# Patient Record
Sex: Female | Born: 2005
Health system: Southern US, Community
[De-identification: ages and names within clinical notes are randomized; demographics above are authoritative.]

## PROBLEM LIST (undated history)

## (undated) DIAGNOSIS — Z8489 Family history of other specified conditions: Secondary | ICD-10-CM

## (undated) DIAGNOSIS — F419 Anxiety disorder, unspecified: Secondary | ICD-10-CM

## (undated) DIAGNOSIS — T7840XA Allergy, unspecified, initial encounter: Secondary | ICD-10-CM

## (undated) HISTORY — PX: NO PAST SURGERIES: SHX2092

## (undated) HISTORY — DX: Allergy, unspecified, initial encounter: T78.40XA

## (undated) HISTORY — DX: Anxiety disorder, unspecified: F41.9

---
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
## 2023-01-20 ENCOUNTER — Other Ambulatory Visit: Payer: Self-pay | Admitting: Licensed Practical Nurse

## 2023-01-20 DIAGNOSIS — F3289 Other specified depressive episodes: Secondary | ICD-10-CM

## 2023-01-30 ENCOUNTER — Encounter: Payer: Self-pay | Admitting: Certified Nurse Midwife

## 2023-01-30 ENCOUNTER — Ambulatory Visit (INDEPENDENT_AMBULATORY_CARE_PROVIDER_SITE_OTHER): Payer: 59 | Admitting: Certified Nurse Midwife

## 2023-01-30 VITALS — BP 105/70 | HR 83 | Wt 127.1 lb

## 2023-01-30 DIAGNOSIS — Z3009 Encounter for other general counseling and advice on contraception: Secondary | ICD-10-CM | POA: Diagnosis not present

## 2023-01-30 MED ORDER — BUPROPION HCL ER (XL) 150 MG PO TB24: 150.0000 mg | ORAL_TABLET | Freq: Every day | ORAL | 3 refills | Status: DC

## 2023-01-30 NOTE — Progress Notes (Signed)
  Medication Management Clinic Visit Note  Patient: Kathleen Brewer MRN: [id number] Date of Birth: October 16, 2005 PCP: [facility]   Hal Morales 17 y.o. female presents for medication follow up visit today. She was started on Zoloft for anxiety/depression on 10/28/22.   BP 105/70   Pulse 83   Wt 127 lb 1.6 oz (57.7 kg)   LMP 01/17/2023   BMI 21.15 kg/m   Patient Information   Past Medical History:  Diagnosis Date   Allergy    Anxiety    Family history of adverse reaction to anesthesia       Past Surgical History:  Procedure Laterality Date   KNEE ARTHROSCOPY WITH ANTERIOR CRUCIATE LIGAMENT (ACL) REPAIR WITH HAMSTRING GRAFT Right 03/08/2021   Procedure: RIGHT KNEE ARTHROSCOPY WITH ANTERIOR CRUCIATE LIGAMENT (ACL) REPAIR WITH HAMSTRING GRAFT and LATERAL MENISCUS REPAIR;  Surgeon: Juanell Fairly, MD;  Location: [facility] ORS;  Service: Orthopedics;  Laterality: Right;   NO PAST SURGERIES      No family history on file. Social History   Substance and Sexual Activity  Alcohol Use No      Social History   Tobacco Use  Smoking Status Never  Smokeless Tobacco Never      Health Maintenance  Topic Date Due   COVID-19 Vaccine (1) Never done   DTaP/Tdap/Td (1 - Tdap) Never done   HPV VACCINES (1 - 2-dose series) Never done   HIV Screening  Never done   Hepatitis C Screening  Never done   INFLUENZA VACCINE  04/03/2023   CHLAMYDIA SCREENING  08/02/2023      01/30/2023    9:34 AM 10/28/2022    9:52 AM 08/01/2022    3:55 PM  PHQ9 SCORE ONLY  PHQ-9 Total Score 6 10 10       01/30/2023    9:36 AM 10/28/2022    9:50 AM 08/01/2022    3:56 PM  GAD 7 : Generalized Anxiety Score  Nervous, Anxious, on Edge 1 2 1   Control/stop worrying 1 2 2   Worry too much - different things 1 2 3   Trouble relaxing 0 1 0  Restless 0 0 0  Easily annoyed or irritable 2 2 1   Afraid - awful might happen 1 0 0  Total GAD 7 Score 6 9 7   Anxiety Difficulty Somewhat difficult  Somewhat difficult Somewhat difficult        Assessment and Plan:  Pt symptoms have improved but she expresses that she is having numbing , she is not able to feel emotions due to medications and would like to explore other medication options. Will try Wellbutrin xl 150 mg daily. Pt instructed to take 1 wk of Zoloft at 25 mg then transition to the Zoloft.   She also request to discuss BC options , she has concern that her current OCP may be contributing to her emotions. We reviewed prescription and non prescription options for birth control . We discussed possible birth control options. She will consider and let me know.   Follow up 4 weeks for medication check.  Face to face time 15 min.   Doreene Burke, CNM

## 2023-02-25 ENCOUNTER — Ambulatory Visit: Payer: 59 | Admitting: Certified Nurse Midwife

## 2023-04-04 ENCOUNTER — Encounter: Payer: Self-pay | Admitting: Certified Nurse Midwife

## 2023-04-04 NOTE — Telephone Encounter (Signed)
Patient calling to ask if a letter can be written on her behalf discussing her anxiety in hopes to break her apartment lease. I advised Pattricia Boss, her regular provider, was not in office today but she insisted on sending it to others as well. She was made aware Pattricia Boss will be on call over the weekend and maybe able to assist then. Please advise.

## 2023-04-04 NOTE — Telephone Encounter (Signed)
GAD 7 score was a 12

## 2023-04-07 NOTE — Telephone Encounter (Signed)
Patient calling to check and see if the letter that was requested has been done. Patient states that it is time sensative that she gets it done.

## 2023-05-14 ENCOUNTER — Other Ambulatory Visit: Payer: Self-pay | Admitting: Certified Nurse Midwife

## 2023-05-14 ENCOUNTER — Telehealth: Payer: Self-pay

## 2023-05-14 DIAGNOSIS — Z3009 Encounter for other general counseling and advice on contraception: Secondary | ICD-10-CM

## 2023-05-14 DIAGNOSIS — F3289 Other specified depressive episodes: Secondary | ICD-10-CM

## 2023-05-14 MED ORDER — NORETHIN ACE-ETH ESTRAD-FE 1-20 MG-MCG PO TABS: 1.0000 | ORAL_TABLET | Freq: Every day | ORAL | 3 refills | Status: DC

## 2023-05-14 MED ORDER — BUPROPION HCL ER (XL) 150 MG PO TB24: 150.0000 mg | ORAL_TABLET | Freq: Every day | ORAL | 0 refills | Status: DC

## 2023-05-14 NOTE — Telephone Encounter (Signed)
Is this ok to refill? I didn't see where she came to her 4 week medication f/up. Please advise.

## 2023-05-14 NOTE — Telephone Encounter (Signed)
Pt transferred to make a f/up visit with Pattricia Boss for the Wellbutrin. One refill sent in and [facility] refill sent to pharmacy.

## 2023-05-21 ENCOUNTER — Encounter: Payer: Self-pay | Admitting: Certified Nurse Midwife

## 2023-05-21 ENCOUNTER — Telehealth (INDEPENDENT_AMBULATORY_CARE_PROVIDER_SITE_OTHER): Payer: Medicaid Other | Admitting: Certified Nurse Midwife

## 2023-05-21 DIAGNOSIS — R454 Irritability and anger: Secondary | ICD-10-CM | POA: Diagnosis not present

## 2023-05-21 DIAGNOSIS — F419 Anxiety disorder, unspecified: Secondary | ICD-10-CM | POA: Diagnosis not present

## 2023-05-21 DIAGNOSIS — Z79899 Other long term (current) drug therapy: Secondary | ICD-10-CM

## 2023-05-21 MED ORDER — BUPROPION HCL ER (XL) 150 MG PO TB24: 150.0000 mg | ORAL_TABLET | Freq: Two times a day (BID) | ORAL | 1 refills | Status: DC

## 2023-05-21 NOTE — Progress Notes (Signed)
Virtual Visit via Video Note  I connected with Kathleen Brewer on 05/21/23 at  1:55 PM EDT by a video enabled telemedicine application and verified that I am speaking with the correct person using two identifiers.  Location: Patient: at home  Provider: at the office    I discussed the limitations of evaluation and management by telemedicine and the availability of in person appointments. The patient expressed understanding and agreed to proceed.  History of Present Illness: Pt for medication management. She was switched from Zoloft to Wellbutrin on 01/30/23   Observations/Objective: Pt states she is not experiencing numbness with emotions but state she still feels "angry /irritation"  and feels like this is above her normal level.   Assessment and Plan:    05/21/2023    1:24 PM 01/30/2023    9:34 AM 10/28/2022    9:52 AM  PHQ9 SCORE ONLY  PHQ-9 Total Score 6 6 10       05/21/2023    1:22 PM 01/30/2023    9:36 AM 10/28/2022    9:50 AM 08/01/2022    3:56 PM  GAD 7 : Generalized Anxiety Score  Nervous, Anxious, on Edge 1 1 2 1   Control/stop worrying 1 1 2 2   Worry too much - different things 1 1 2 3   Trouble relaxing 0 0 1 0  Restless 1 0 0 0  Easily annoyed or irritable 3 2 2 1   Afraid - awful might happen 0 1 0 0  Total GAD 7 Score 7 6 9 7   Anxiety Difficulty Somewhat difficult Somewhat difficult Somewhat difficult Somewhat difficult   Her gad score has increased by 1. Discussed increasing dose ver, switching medications , vs sending her to psychiatrist to manage. She would like to try increasing dose BID . Orders placed.    Follow Up Instructions: 4-6 weeks for evaluation of effectiveness.    I discussed the assessment and treatment plan with the patient. The patient was provided an opportunity to ask questions and all were answered. The patient agreed with the plan and demonstrated an understanding of the instructions.   The patient was advised to call back or seek an  in-person evaluation if the symptoms worsen or if the condition fails to improve as anticipated.  I provided 10  minutes of non-face-to-face time during this encounter.   Doreene Burke, CNM

## 2023-05-21 NOTE — Progress Notes (Signed)
    05/21/2023    1:22 PM 01/30/2023    9:36 AM 10/28/2022    9:50 AM 08/01/2022    3:56 PM  GAD 7 : Generalized Anxiety Score  Nervous, Anxious, on Edge 1 1 2 1   Control/stop worrying 1 1 2 2   Worry too much - different things 1 1 2 3   Trouble relaxing 0 0 1 0  Restless 1 0 0 0  Easily annoyed or irritable 3 2 2 1   Afraid - awful might happen 0 1 0 0  Total GAD 7 Score 7 6 9 7   Anxiety Difficulty Somewhat difficult Somewhat difficult Somewhat difficult Somewhat difficult      05/21/2023    1:24 PM 01/30/2023    9:34 AM 10/28/2022    9:52 AM  PHQ9 SCORE ONLY  PHQ-9 Total Score 6 6 10

## 2023-06-02 ENCOUNTER — Telehealth: Payer: Self-pay

## 2023-06-02 ENCOUNTER — Other Ambulatory Visit: Payer: Self-pay | Admitting: Certified Nurse Midwife

## 2023-06-02 MED ORDER — BUPROPION HCL ER (SR) 150 MG PO TB12: 150.0000 mg | ORAL_TABLET | Freq: Two times a day (BID) | ORAL | 3 refills | Status: DC

## 2023-06-02 NOTE — Telephone Encounter (Signed)
[facility] called, [facility] insurance will not pay for the 150MG  twice daily, but insurance will pay for the 300MG . Can we switch the prescription?

## 2023-06-03 ENCOUNTER — Telehealth: Payer: Self-pay

## 2023-06-03 NOTE — Telephone Encounter (Signed)
I called [facility] and left a voicemail

## 2023-06-16 ENCOUNTER — Telehealth: Payer: Self-pay

## 2023-06-16 NOTE — Telephone Encounter (Signed)
Pls call pt to schedule annual. Once scheduled send msg back to me so I can send RF to [facility] pharmacy.  Pt talked to after hours nurse line over the weekend requesting birth control refill to be sent to a pharmacy in [facility] (she's currently out of town). Advised pt she will soon be due for her annual, to go ahead and schedule it so I can send [facility] RF that will last her until her annual appt.

## 2023-06-17 ENCOUNTER — Other Ambulatory Visit: Payer: Self-pay

## 2023-06-17 DIAGNOSIS — Z3009 Encounter for other general counseling and advice on contraception: Secondary | ICD-10-CM

## 2023-06-17 MED ORDER — NORETHIN ACE-ETH ESTRAD-FE 1-20 MG-MCG PO TABS: 1.0000 | ORAL_TABLET | Freq: Every day | ORAL | 0 refills | Status: DC

## 2023-06-17 NOTE — Telephone Encounter (Signed)
1 RF sent and pt is aware.

## 2023-06-17 NOTE — Telephone Encounter (Signed)
The patient lasted annual was 08/01/22. We currently do not have Decembers scheduled release. The patient has been added to wait list to contact for scheduling.   Rx sent. Pt aware.

## 2023-06-17 NOTE — Telephone Encounter (Signed)
The patient lasted annual was 08/01/22. We currently do not have Decembers scheduled release. The patient has been added to wait list to contact for scheduling.  [facility] [address] [place], [place] Contact number: [phone]

## 2023-08-06 ENCOUNTER — Ambulatory Visit (INDEPENDENT_AMBULATORY_CARE_PROVIDER_SITE_OTHER): Payer: Medicaid Other | Admitting: Licensed Practical Nurse

## 2023-08-06 ENCOUNTER — Other Ambulatory Visit (HOSPITAL_COMMUNITY)
Admission: RE | Admit: 2023-08-06 | Discharge: 2023-08-06 | Disposition: A | Discharge status: Home / Self Care | Payer: Medicaid Other | Source: Ambulatory Visit | Attending: Licensed Practical Nurse | Admitting: Licensed Practical Nurse

## 2023-08-06 ENCOUNTER — Encounter: Payer: Self-pay | Admitting: Licensed Practical Nurse

## 2023-08-06 VITALS — BP 112/76 | HR 86 | Wt 130.3 lb

## 2023-08-06 DIAGNOSIS — Z3041 Encounter for surveillance of contraceptive pills: Secondary | ICD-10-CM

## 2023-08-06 DIAGNOSIS — Z01419 Encounter for gynecological examination (general) (routine) without abnormal findings: Secondary | ICD-10-CM | POA: Insufficient documentation

## 2023-08-06 DIAGNOSIS — R5383 Other fatigue: Secondary | ICD-10-CM

## 2023-08-06 DIAGNOSIS — Z113 Encounter for screening for infections with a predominantly sexual mode of transmission: Secondary | ICD-10-CM | POA: Diagnosis present

## 2023-08-06 MED ORDER — LEVONORGEST-ETH ESTRAD 91-DAY 0.15-0.03 &0.01 MG PO TABS: 1.0000 | ORAL_TABLET | Freq: Every day | ORAL | 4 refills | Status: AC

## 2023-08-06 NOTE — Progress Notes (Signed)
Gynecology Annual Exam  PCP: [facility]  Chief Complaint:  Chief Complaint  Patient presents with   Gynecologic Exam    History of Present Illness: Patient is a 17 y.o. G0P0000 presents for annual exam. The patient has some concerns today:  Fatigue: Dalilah feels "tired all of the time", she does feel the need to take naps, she sleeps 6-8 hours per night, does not feel rested when she wakes up, denies snoring. A friend of hers was prescribed Phentermine for energy and it helped her.  Has struggled with Depression and anxiety, stopped medication because it was "not doing anything", is not interested in therapy. Feels that her mood is ok right now.  Bloating: has abdominal pains and bloating whenever she eats, no matter what the food is or the amount that she eats. She did see a provider when she was 14 that told her nothing was wrong. She did avoid dairy for 1 week, which did not help. She tends to avoid gluten but is not gluten free. She eats 3 balanced meals a day, does not snack. Has 1 BM a day.    LMP: No LMP recorded. (Menstrual status: Irregular Periods). Average Interval: irregular, had 2 cycles last month, does not always get a cycle and is not always during the placebo week.  Duration of flow: 5 days Heavy Menses: no Clots: no Intermenstrual Bleeding: no Postcoital Bleeding: no Dysmenorrhea: some cramping   The patient is sexually active has had 2 female partners in the last 3 months. She currently uses OCP (estrogen/progesterone) for contraception. She denies dyspareunia.  The patient does not perform self breast exams.  There is no notable family history of breast or ovarian cancer in her family.  The patient wears seatbelts: yes.  The patient has regular exercise: yes.  Pilates   The patient repots current symptoms of depression.  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  Lives with her aunt PCP does not have Dental last seen 1  year ago, needs new dentist    Review of Systems: ROS see HPI   Past Medical History:  Patient Active Problem List   Diagnosis Date Noted Date Diagnosed   Anger 05/21/2023    Anxiety 05/21/2023    Depression 10/28/2022    Fatigue 07/03/2021     Past Surgical History:  Past Surgical History:  Procedure Laterality Date   KNEE ARTHROSCOPY WITH ANTERIOR CRUCIATE LIGAMENT (ACL) REPAIR WITH HAMSTRING GRAFT Right 03/08/2021   Procedure: RIGHT KNEE ARTHROSCOPY WITH ANTERIOR CRUCIATE LIGAMENT (ACL) REPAIR WITH HAMSTRING GRAFT and LATERAL MENISCUS REPAIR;  Surgeon: Juanell Fairly, MD;  Location: [facility] ORS;  Service: Orthopedics;  Laterality: Right;   NO PAST SURGERIES      Gynecologic History:  No LMP recorded. (Menstrual status: Irregular Periods). Contraception: OCP (estrogen/progesterone) Last Pap: Results were: due at age 61   Obstetric History: G0P0000  Family History:  No family history on file.  Social History:  Social History   Socioeconomic History   Marital status: Single    Spouse name: Not on file   Number of children: Not on file   Years of education: Not on file   Highest education level: Not on file  Occupational History   Not on file  Tobacco Use   Smoking status: Never   Smokeless tobacco: Never  Vaping Use   Vaping status: Every Day  Substance and Sexual Activity   Alcohol use: No  Drug use: Not Currently    Comment: has tried weed per mom   Sexual activity: Yes    Birth control/protection: Pill  Other Topics Concern   Not on file  Social History Narrative   Not on file   Social Determinants of Health   Financial Resource Strain: Not on file  Food Insecurity: Not on file  Transportation Needs: Not on file  Physical Activity: Not on file  Stress: Not on file  Social Connections: Not on file  Intimate Partner Violence: Not on file    Allergies:  No Known Allergies  Medications: Prior to Admission medications   Medication Sig Start Date  End Date Taking? Authorizing Provider  acetaminophen (TYLENOL) 500 MG tablet Take 1,000 mg by mouth every 6 (six) hours as needed for moderate pain or headache.   Yes [provider]  Levonorgestrel-Ethinyl Estradiol (AMETHIA) 0.15-0.03 &0.01 MG tablet Take 1 tablet by mouth daily. 08/06/23  Yes Saraiya Kozma, Courtney Heys, CNM    Physical Exam Vitals: Blood pressure 112/76, pulse 86, weight 130 lb 4.8 oz (59.1 kg).  General: NAD HEENT: normocephalic, anicteric Thyroid: no enlargement, no palpable nodules Pulmonary: No increased work of breathing, CTAB Cardiovascular: RRR, distal pulses 2+ Breast: exam deferred  Abdomen: NABS, soft, non-tender, non-distended.  Umbilicus without lesions.  No hepatomegaly, splenomegaly or masses palpable. No evidence of hernia  Genitourinary: Deferred   External:   Vagina:    Cervix:   Uterus:   Adnexa:   Rectal: deferred  Lymphatic: no evidence of inguinal lymphadenopathy Extremities: no edema, erythema, or tenderness Neurologic: Grossly intact Psychiatric: mood appropriate, affect full  PHQ-9 5 GAD7- 2  Assessment: 17 y.o. G0P0000 routine annual exam  Plan: Problem List Items Addressed This Visit       Other   Fatigue   Relevant Orders   CBC   TSH + free T4   Other Visit Diagnoses     Well woman exam    -  Primary   Relevant Medications   Levonorgestrel-Ethinyl Estradiol (AMETHIA) 0.15-0.03 &0.01 MG tablet   Other Relevant Orders   CBC   TSH + free T4   HEP, RPR, HIV Panel   Cervicovaginal ancillary only   Screening examination for venereal disease       Relevant Orders   HEP, RPR, HIV Panel   Cervicovaginal ancillary only   Encounter for surveillance of contraceptive pills       Relevant Medications   Levonorgestrel-Ethinyl Estradiol (AMETHIA) 0.15-0.03 &0.01 MG tablet       1) 4) Gardasil Series discussed and if applicable offered to patient - Patient has previously completed 3 shot series   2) STI screening   wasoffered and accepted  3)  ASCCP guidelines and rational discussed.  Due at age 76   4) Contraception - the patient is currently using  OCP (estrogen/progesterone). Will switch to continuous use pills  5) fatigue and bloating: encouraged to see PCP for further work up  -encouraged pt to consider therapy, feeling tired all of the time could be a symptom of depression  -healthy ways to boost energy reviewed, a small amount of caffeine is ok.    Carie Caddy, CNM  [facility] 08/06/2023, 12:39 PM

## 2023-08-07 LAB — CBC
Hematocrit: 40.5 % (ref 34.0–46.6)
Hemoglobin: 13.2 g/dL (ref 11.1–15.9)
MCH: 31.1 pg (ref 26.6–33.0)
MCHC: 32.6 g/dL (ref 31.5–35.7)
MCV: 95 fL (ref 79–97)
Platelets: 240 10*3/uL (ref 150–450)
RBC: 4.25 x10E6/uL (ref 3.77–5.28)
RDW: 11.7 % (ref 11.7–15.4)
WBC: 5.9 10*3/uL (ref 3.4–10.8)

## 2023-08-07 LAB — HEP, RPR, HIV PANEL
HIV Screen 4th Generation wRfx: NONREACTIVE
Hepatitis B Surface Ag: NEGATIVE
RPR Ser Ql: NONREACTIVE

## 2023-08-07 LAB — TSH+FREE T4
Free T4: 1.16 ng/dL (ref 0.93–1.60)
TSH: 1.29 u[IU]/mL (ref 0.450–4.500)

## 2023-08-08 LAB — CERVICOVAGINAL ANCILLARY ONLY
Chlamydia: NEGATIVE
Comment: NEGATIVE
Comment: NEGATIVE
Comment: NORMAL
Neisseria Gonorrhea: NEGATIVE
Trichomonas: NEGATIVE

## 2023-11-19 ENCOUNTER — Ambulatory Visit
Admission: RE | Admit: 2023-11-19 | Discharge: 2023-11-19 | Disposition: A | Discharge status: Home / Self Care | Payer: Self-pay | Source: Ambulatory Visit | Attending: Family Medicine | Admitting: Family Medicine

## 2023-11-19 VITALS — BP 121/80 | HR 90 | Temp 98.5°F | Resp 16

## 2023-11-19 DIAGNOSIS — J069 Acute upper respiratory infection, unspecified: Secondary | ICD-10-CM | POA: Diagnosis present

## 2023-11-19 LAB — RESP PANEL BY RT-PCR (FLU A&B, COVID) ARPGX2
Influenza A by PCR: NEGATIVE
Influenza B by PCR: NEGATIVE
SARS Coronavirus 2 by RT PCR: NEGATIVE

## 2023-11-19 LAB — GROUP A STREP BY PCR: Group A Strep by PCR: NOT DETECTED

## 2023-11-19 NOTE — ED Triage Notes (Signed)
 Fatigue, body aches, sore throat x 2 days. Taking OTC allergy medication,and dyquil.   Pt states she has been around her friend that is positive for strep throat currently.

## 2023-11-19 NOTE — Discharge Instructions (Addendum)
 Strep, COVID and influenza tests are negative.  You may have a viral respiratory infection that will gradually improve over the next 7-10 days. Cough may last up to 3 weeks.    You can take Tylenol and/or Ibuprofen as needed for fever reduction and pain relief.    For cough: honey 1/2 to 1 teaspoon (you can dilute the honey in water or another fluid).  You can also use guaifenesin and dextromethorphan for cough. You can use a humidifier for chest congestion and cough.  If you don't have a humidifier, you can sit in the bathroom with the hot shower running.      For sore throat: try warm salt water gargles, Mucinex sore throat cough drops or cepacol lozenges, throat spray, warm tea or water with lemon/honey, popsicles or ice, or OTC cold relief medicine for throat discomfort. You can also purchase chloraseptic spray at the pharmacy or dollar store.   For congestion: take a daily anti-histamine like Zyrtec, Claritin, and a oral decongestant, such as pseudoephedrine.  You can also use Flonase 1-2 sprays in each nostril daily. Afrin is also a good option, if you do not have high blood pressure.    It is important to stay hydrated: drink plenty of fluids (water, gatorade/powerade/pedialyte, juices, or teas) to keep your throat moisturized and help further relieve irritation/discomfort.    Return or go to the Emergency Department if symptoms worsen or do not improve in the next few days

## 2023-11-19 NOTE — ED Provider Notes (Signed)
 [facility]    CSN: [id number] Arrival date & time: 11/19/23  1345      History   Chief Complaint Chief Complaint  Patient presents with   Fatigue    Sore throatExtreme fatigue - Entered by patient   Sore Throat    HPI SUNSET JOSHI is a 18 y.o. female.   HPI  History obtained from the patient. Yania presents for 3 days of fatigue, body aches, sore throat and hoarseness.  No vomiting, diarrhea, fever.  States she has been overly tired. Denies rash, headache, shortness of breath, abdominal pain and rash. Endorses nasal congestion and rhinorrhea. Taking her allergy medication has helped. She has been eating and drinking well.   She was exposed to strep throat on Friday.        Past Medical History:  Diagnosis Date   Allergy    Anxiety    Family history of adverse reaction to anesthesia     Patient Active Problem List   Diagnosis Date Noted   Anger 05/21/2023   Anxiety 05/21/2023   Depression 10/28/2022   Fatigue 07/03/2021    Past Surgical History:  Procedure Laterality Date   KNEE ARTHROSCOPY WITH ANTERIOR CRUCIATE LIGAMENT (ACL) REPAIR WITH HAMSTRING GRAFT Right 03/08/2021   Procedure: RIGHT KNEE ARTHROSCOPY WITH ANTERIOR CRUCIATE LIGAMENT (ACL) REPAIR WITH HAMSTRING GRAFT and LATERAL MENISCUS REPAIR;  Surgeon: Juanell Fairly, MD;  Location: [facility] OR[facility];  Service: Orthopedics;  Laterality: Right;   NO PAST SURGERIES     WISDOM TOOTH EXTRACTION Bilateral 2024    OB History     Gravida  0   Para  0   Term  0   Preterm  0   AB  0   Living  0      SAB  0   IAB  0   Ectopic  0   Multiple  0   Live Births  0            Home Medications    Prior to Admission medications   Medication Sig Start Date End Date Taking? Authorizing Provider  BLISOVI FE 1/20 1-20 MG-MCG tablet Take 1 tablet by mouth daily. 09/04/23  Yes [provider]  acetaminophen (TYLENOL) 500 MG tablet Take 1,000 mg by mouth every 6 (six) hours  as needed for moderate pain or headache.    [provider]  Levonorgestrel-Ethinyl Estradiol (AMETHIA) 0.15-0.03 &0.01 MG tablet Take 1 tablet by mouth daily. 08/06/23   Dominic, Courtney Heys, CNM    Family History History reviewed. No pertinent family history.  Social History Social History   Tobacco Use   Smoking status: Never   Smokeless tobacco: Never  Vaping Use   Vaping status: Some Days  Substance Use Topics   Alcohol use: No   Drug use: Not Currently    Comment: has tried weed per mom     Allergies   Patient has no known allergies.   Review of Systems Review of Systems: negative unless otherwise stated in HPI.      Physical Exam Triage Vital Signs ED Triage Vitals [11/19/23 1412]  Encounter Vitals Group     BP 121/80     Systolic BP Percentile      Diastolic BP Percentile      Pulse Rate 90     Resp 16     Temp 98.5 F (36.9 C)     Temp Source Oral     SpO2 97 %  Weight      Height      Head Circumference      Peak Flow      Pain Score 5     Pain Loc      Pain Education      Exclude from Growth Chart    No data found.  Updated Vital Signs BP 121/80 (BP Location: Right Arm)   Pulse 90   Temp 98.5 F (36.9 C) (Oral)   Resp 16   LMP 11/04/2023 (Approximate)   SpO2 97%   Visual Acuity Right Eye Distance:   Left Eye Distance:   Bilateral Distance:    Right Eye Near:   Left Eye Near:    Bilateral Near:     Physical Exam GEN:     alert, non-toxic appearing female in no distress    HENT:  mucus membranes moist, oropharyngeal without lesions, mild erythema, no tonsillar hypertrophy or exudates, clear nasal discharge EYES:   no scleral injection or discharge NECK:  normal ROM, no lymphadenopathy, no meningismus   RESP:  no increased work of breathing, Skin:   warm and dry, no rash on visible skin    UC Treatments / Results  Labs (all labs ordered are listed, but only abnormal results are displayed) Labs Reviewed  GROUP A  STREP BY PCR  RESP PANEL BY RT-PCR (FLU A&B, COVID) [id number]    EKG   Radiology No results found.  Procedures Procedures (including critical care time)  Medications Ordered in UC Medications - No data to display  Initial Impression / Assessment and Plan / UC Course  I have reviewed the triage vital signs and the nursing notes.  Pertinent labs & imaging results that were available during my care of the patient were reviewed by me and considered in my medical decision making (see chart for details).       Pt is a 17 y.o. female who presents for 3 days of respiratory symptoms. Heidi is afebrile here without recent antipyretics. Satting well on room air. Overall pt is non-toxic appearing, well hydrated, without respiratory distress.COVID and influenza panel obtained and was negative. Strep PCR is negative. Discussed possibility of mono with patient but she declined testing. Asks for antibiotics.  Explained lack of efficacy of antibiotics in viral disease.  Typical duration of symptoms discussed. Pt requesting work note which was provided.   Return and ED precautions given and voiced understanding. Discussed MDM, treatment plan and plan for follow-up with patient who agrees with plan.     Final Clinical Impressions(s) / UC Diagnoses   Final diagnoses:  URI with cough and congestion     Discharge Instructions      Strep, COVID and influenza tests are negative.  You may have a viral respiratory infection that will gradually improve over the next 7-10 days. Cough may last up to 3 weeks.    You can take Tylenol and/or Ibuprofen as needed for fever reduction and pain relief.    For cough: honey 1/2 to 1 teaspoon (you can dilute the honey in water or another fluid).  You can also use guaifenesin and dextromethorphan for cough. You can use a humidifier for chest congestion and cough.  If you don't have a humidifier, you can sit in the bathroom with the hot shower running.      For  sore throat: try warm salt water gargles, Mucinex sore throat cough drops or cepacol lozenges, throat spray, warm tea or water with lemon/honey, popsicles or ice,  or OTC cold relief medicine for throat discomfort. You can also purchase chloraseptic spray at the pharmacy or dollar store.   For congestion: take a daily anti-histamine like Zyrtec, Claritin, and a oral decongestant, such as pseudoephedrine.  You can also use Flonase 1-2 sprays in each nostril daily. Afrin is also a good option, if you do not have high blood pressure.    It is important to stay hydrated: drink plenty of fluids (water, gatorade/powerade/pedialyte, juices, or teas) to keep your throat moisturized and help further relieve irritation/discomfort.    Return or go to the Emergency Department if symptoms worsen or do not improve in the next few days      ED Prescriptions   None    PDMP not reviewed this encounter.   Katha Cabal, DO 11/19/23 1518

## 2024-05-04 ENCOUNTER — Ambulatory Visit
Admission: EM | Admit: 2024-05-04 | Discharge: 2024-05-04 | Disposition: A | Discharge status: Home / Self Care | Attending: Emergency Medicine | Admitting: Emergency Medicine

## 2024-05-04 DIAGNOSIS — J029 Acute pharyngitis, unspecified: Secondary | ICD-10-CM | POA: Diagnosis not present

## 2024-05-04 DIAGNOSIS — J069 Acute upper respiratory infection, unspecified: Secondary | ICD-10-CM | POA: Diagnosis not present

## 2024-05-04 LAB — POC COVID19/FLU A&B COMBO
Covid Antigen, POC: NEGATIVE
Influenza A Antigen, POC: NEGATIVE
Influenza B Antigen, POC: NEGATIVE

## 2024-05-04 LAB — POCT RAPID STREP A (OFFICE): Rapid Strep A Screen: NEGATIVE

## 2024-05-04 MED ORDER — LIDOCAINE VISCOUS HCL 2 % MT SOLN
15.0000 mL | OROMUCOSAL | 0 refills | Status: AC | PRN
Start: 2024-05-04 — End: ?

## 2024-05-04 NOTE — ED Triage Notes (Signed)
 Patient to Urgent Care with complaints of sore throat/ headaches/ body aches/ possible fevers/ chills.   Symptoms started Sunday afternoon.   Meds: Dayquil (no relief).

## 2024-05-04 NOTE — ED Provider Notes (Signed)
 [facility]    CSN: [id number] Arrival date & time: 05/04/24  9077      History   Chief Complaint Chief Complaint  Patient presents with   Sore Throat    HPI Kathleen Brewer is a 18 y.o. female.  Patient presents with 1 day history of subjective fever, chills, body aches, headache, sore throat, nausea.  She has been treating her symptoms with DayQuil; none taken today.  No cough, shortness of breath, vomiting, diarrhea.  The history is provided by the patient and medical records.    Past Medical History:  Diagnosis Date   Allergy    Anxiety    Family history of adverse reaction to anesthesia     Patient Active Problem List   Diagnosis Date Noted   Anger 05/21/2023   Anxiety 05/21/2023   Depression 10/28/2022   Fatigue 07/03/2021    Past Surgical History:  Procedure Laterality Date   KNEE ARTHROSCOPY WITH ANTERIOR CRUCIATE LIGAMENT (ACL) REPAIR WITH HAMSTRING GRAFT Right 03/08/2021   Procedure: RIGHT KNEE ARTHROSCOPY WITH ANTERIOR CRUCIATE LIGAMENT (ACL) REPAIR WITH HAMSTRING GRAFT and LATERAL MENISCUS REPAIR;  Surgeon: Marchia Drivers, MD;  Location: [facility];  Service: Orthopedics;  Laterality: Right;   NO PAST SURGERIES     WISDOM TOOTH EXTRACTION Bilateral 2024    OB History     Gravida  0   Para  0   Term  0   Preterm  0   AB  0   Living  0      SAB  0   IAB  0   Ectopic  0   Multiple  0   Live Births  0            Home Medications    Prior to Admission medications   Medication Sig Start Date End Date Taking? Authorizing Provider  lidocaine  (XYLOCAINE ) 2 % solution Use as directed 15 mLs in the mouth or throat as needed for mouth pain (Gargle and spit out.). 05/04/24  Yes Corlis Burnard DEL, NP  acetaminophen  (TYLENOL ) 500 MG tablet Take 1,000 mg by mouth every 6 (six) hours as needed for moderate pain or headache.    [provider]  BLISOVI FE 1/20 1-20 MG-MCG tablet Take 1 tablet by mouth daily. 09/04/23   [provider]  Levonorgestrel-Ethinyl Estradiol (AMETHIA) 0.15-0.03 &0.01 MG tablet Take 1 tablet by mouth daily. 08/06/23   Dominic, Jinnie Jansky, CNM    Family History History reviewed. No pertinent family history.  Social History Social History   Tobacco Use   Smoking status: Never   Smokeless tobacco: Never  Vaping Use   Vaping status: Some Days  Substance Use Topics   Alcohol use: No   Drug use: Not Currently    Comment: has tried weed per mom     Allergies   Patient has no known allergies.   Review of Systems Review of Systems  Constitutional:  Positive for chills and fever.  HENT:  Positive for sore throat. Negative for ear pain.   Respiratory:  Negative for cough and shortness of breath.   Gastrointestinal:  Negative for diarrhea and vomiting.     Physical Exam Triage Vital Signs ED Triage Vitals  Encounter Vitals Group     BP 05/04/24 1001 121/69     Girls Systolic BP Percentile --      Girls Diastolic BP Percentile --      Boys Systolic BP Percentile --  Boys Diastolic BP Percentile --      Pulse Rate 05/04/24 1001 90     Resp 05/04/24 1001 20     Temp 05/04/24 1001 98 F (36.7 C)     Temp src --      SpO2 05/04/24 1001 98 %     Weight --      Height --      Head Circumference --      Peak Flow --      Pain Score 05/04/24 0952 8     Pain Loc --      Pain Education --      Exclude from Growth Chart --    No data found.  Updated Vital Signs BP 121/69   Pulse 90   Temp 98 F (36.7 C)   Resp 20   LMP 04/28/2024   SpO2 98%   Visual Acuity Right Eye Distance:   Left Eye Distance:   Bilateral Distance:    Right Eye Near:   Left Eye Near:    Bilateral Near:     Physical Exam Constitutional:      General: She is not in acute distress. HENT:     Right Ear: Tympanic membrane normal.     Left Ear: Tympanic membrane normal.     Nose: Nose normal.     Mouth/Throat:     Mouth: Mucous membranes are moist.     Pharynx: Posterior  oropharyngeal erythema present.  Cardiovascular:     Rate and Rhythm: Normal rate and regular rhythm.     Heart sounds: Normal heart sounds.  Pulmonary:     Effort: Pulmonary effort is normal. No respiratory distress.     Breath sounds: Normal breath sounds.  Abdominal:     General: Bowel sounds are normal.     Palpations: Abdomen is soft.     Tenderness: There is no abdominal tenderness. There is no guarding or rebound.  Neurological:     Mental Status: She is alert.      UC Treatments / Results  Labs (all labs ordered are listed, but only abnormal results are displayed) Labs Reviewed  POC COVID19/FLU A&B COMBO - Normal  POCT RAPID STREP A (OFFICE) - Normal    EKG   Radiology No results found.  Procedures Procedures (including critical care time)  Medications Ordered in UC Medications - No data to display  Initial Impression / Assessment and Plan / UC Course  I have reviewed the triage vital signs and the nursing notes.  Pertinent labs & imaging results that were available during my care of the patient were reviewed by me and considered in my medical decision making (see chart for details).    Viral pharyngitis, viral URI.  Afebrile and vital signs are stable.  Rapid strep negative.  Rapid COVID and flu negative.  Discussed with patient that she would do well to take a COVID test at home tomorrow as she is in early symptoms and may be too soon to show positive.  Treating sore throat with viscous lidocaine , gargle and spit out.  Discussed symptomatic treatment including Tylenol  or ibuprofen as needed for fever or discomfort, rest, hydration.  Instructed patient to follow-up with PCP if not improving.  ED precautions given.  Patient agrees to plan of care.    Final Clinical Impressions(s) / UC Diagnoses   Final diagnoses:  Viral pharyngitis  Viral URI     Discharge Instructions      The COVID, flu, and strep  tests are negative.   Take Tylenol  or ibuprofen as  needed for fever or discomfort.  Rest and keep yourself hydrated.    Follow-up with your primary care provider if your symptoms are not improving.         ED Prescriptions     Medication Sig Dispense Auth. Provider   lidocaine  (XYLOCAINE ) 2 % solution Use as directed 15 mLs in the mouth or throat as needed for mouth pain (Gargle and spit out.). 100 mL Corlis Burnard DEL, NP      PDMP not reviewed this encounter.   Corlis Burnard DEL, NP 05/04/24 1051

## 2024-05-04 NOTE — Discharge Instructions (Addendum)
 The COVID, flu, and strep tests are negative.   Take Tylenol  or ibuprofen as needed for fever or discomfort.  Rest and keep yourself hydrated.    Follow-up with your primary care provider if your symptoms are not improving.

## 2024-05-13 ENCOUNTER — Other Ambulatory Visit: Payer: Self-pay | Admitting: Certified Nurse Midwife
